# Patient Record
Sex: Male | Born: 1990 | Race: Black or African American | Hispanic: No | Marital: Single | Smoking: Never smoker
Health system: Southern US, Community
[De-identification: ages and names within clinical notes are randomized; demographics above are authoritative.]

## PROBLEM LIST (undated history)

## (undated) DIAGNOSIS — I10 Essential (primary) hypertension: Secondary | ICD-10-CM

---

## 2009-02-18 ENCOUNTER — Emergency Department (HOSPITAL_BASED_OUTPATIENT_CLINIC_OR_DEPARTMENT_OTHER): Admission: EM | Admit: 2009-02-18 | Discharge: 2009-02-18 | Payer: Self-pay | Admitting: Emergency Medicine

## 2011-06-11 ENCOUNTER — Encounter (HOSPITAL_BASED_OUTPATIENT_CLINIC_OR_DEPARTMENT_OTHER): Payer: Self-pay | Admitting: *Deleted

## 2011-06-11 ENCOUNTER — Emergency Department (HOSPITAL_BASED_OUTPATIENT_CLINIC_OR_DEPARTMENT_OTHER)
Admission: EM | Admit: 2011-06-11 | Discharge: 2011-06-11 | Disposition: A | Discharge status: Home / Self Care | Payer: Self-pay | Attending: Emergency Medicine | Admitting: Emergency Medicine

## 2011-06-11 DIAGNOSIS — R112 Nausea with vomiting, unspecified: Secondary | ICD-10-CM | POA: Insufficient documentation

## 2011-06-11 DIAGNOSIS — R109 Unspecified abdominal pain: Secondary | ICD-10-CM | POA: Insufficient documentation

## 2011-06-11 DIAGNOSIS — R197 Diarrhea, unspecified: Secondary | ICD-10-CM | POA: Insufficient documentation

## 2011-06-11 DIAGNOSIS — A084 Viral intestinal infection, unspecified: Secondary | ICD-10-CM

## 2011-06-11 LAB — DIFFERENTIAL
Basophils Absolute: 0 10*3/uL (ref 0.0–0.1)
Eosinophils Relative: 6 % — ABNORMAL HIGH (ref 0–5)
Lymphocytes Relative: 32 % (ref 12–46)
Lymphs Abs: 1.7 10*3/uL (ref 0.7–4.0)
Monocytes Absolute: 0.4 10*3/uL (ref 0.1–1.0)
Monocytes Relative: 8 % (ref 3–12)

## 2011-06-11 LAB — URINALYSIS, ROUTINE W REFLEX MICROSCOPIC
Ketones, ur: NEGATIVE mg/dL
Leukocytes, UA: NEGATIVE
Protein, ur: NEGATIVE mg/dL
Urobilinogen, UA: 1 mg/dL (ref 0.0–1.0)

## 2011-06-11 LAB — CBC
HCT: 47.2 % (ref 39.0–52.0)
Hemoglobin: 16.2 g/dL (ref 13.0–17.0)
MCV: 89.4 fL (ref 78.0–100.0)
RDW: 12.4 % (ref 11.5–15.5)
WBC: 5.1 10*3/uL (ref 4.0–10.5)

## 2011-06-11 LAB — COMPREHENSIVE METABOLIC PANEL
BUN: 12 mg/dL (ref 6–23)
CO2: 31 mEq/L (ref 19–32)
Calcium: 9.7 mg/dL (ref 8.4–10.5)
Creatinine, Ser: 1.2 mg/dL (ref 0.50–1.35)
GFR calc Af Amer: >90 mL/min (ref >90)
GFR calc non Af Amer: 85 mL/min — ABNORMAL LOW (ref >90)
Glucose, Bld: 82 mg/dL (ref 70–99)
Total Bilirubin: 0.3 mg/dL (ref 0.3–1.2)

## 2011-06-11 LAB — URINE MICROSCOPIC-ADD ON

## 2011-06-11 MED ORDER — ONDANSETRON 8 MG PO TBDP: 8.0000 mg | ORAL_TABLET | Freq: Once | ORAL | Status: AC

## 2011-06-11 MED ORDER — ONDANSETRON 8 MG PO TBDP
8.0000 mg | ORAL_TABLET | Freq: Once | ORAL | Status: AC
  Administered 2011-06-11: 8 mg via ORAL
  Filled 2011-06-11: qty 1

## 2011-06-11 NOTE — Discharge Instructions (Signed)
B.R.A.T. Diet Your doctor has recommended the B.R.A.T. diet for you or your child until the condition improves. This is often used to help control diarrhea and vomiting symptoms. If you or your child can tolerate clear liquids, you may have:  Bananas.   Rice.   Applesauce.   Toast (and other simple starches such as crackers, potatoes, noodles).  Be sure to avoid dairy products, meats, and fatty foods until symptoms are better. Fruit juices such as apple, grape, and prune juice can make diarrhea worse. Avoid these. Continue this diet for 2 days or as instructed by your caregiver. Document Released: 02/12/2005 Document Revised: 02/01/2011 Document Reviewed: 08/01/2006 [employer] Patient Information 2012 [employer].  Diet for Diarrhea, Adult Having frequent, runny stools (diarrhea) has many causes. Diarrhea may be caused or worsened by food or drink. Diarrhea may be relieved by changing your diet. IF YOU ARE NOT TOLERATING SOLID FOODS:  Drink enough water and fluids to keep your urine clear or pale yellow.   Avoid sugary drinks and sodas as well as milk-based beverages.   Avoid beverages containing caffeine and alcohol.   You may try rehydrating beverages. You can make your own by following this recipe:    tsp table salt.    tsp baking soda.   ? tsp salt substitute (potassium chloride).   1 tbs + 1 tsp sugar.   1 qt water.  As your stools become more solid, you can start eating solid foods. Add foods one at a time. If a certain food causes your diarrhea to get worse, avoid that food and try other foods. A low fiber, low-fat, and lactose-free diet is recommended. Small, frequent meals may be better tolerated.  Starches  Allowed:  White, French, and pita breads, plain rolls, buns, bagels. Plain muffins, matzo. Soda, saltine, or graham crackers. Pretzels, melba toast, zwieback. Cooked cereals made with water: cornmeal, farina, cream cereals. Dry cereals: refined corn, wheat,  rice. Potatoes prepared any way without skins, refined macaroni, spaghetti, noodles, refined rice.   Avoid:  Bread, rolls, or crackers made with whole wheat, multi-grains, rye, bran seeds, nuts, or coconut. Corn tortillas or taco shells. Cereals containing whole grains, multi-grains, bran, coconut, nuts, or raisins. Cooked or dry oatmeal. Coarse wheat cereals, granola. Cereals advertised as "high-fiber." Potato skins. Whole grain pasta, wild or brown rice. Popcorn. Sweet potatoes/yams. Sweet rolls, doughnuts, waffles, pancakes, sweet breads.  Vegetables  Allowed: Strained tomato and vegetable juices. Most well-cooked and canned vegetables without seeds. Fresh: Tender lettuce, cucumber without the skin, cabbage, spinach, bean sprouts.   Avoid: Fresh, cooked, or canned: Artichokes, baked beans, beet greens, broccoli, Brussels sprouts, corn, kale, legumes, peas, sweet potatoes. Cooked: Green or red cabbage, spinach. Avoid large servings of any vegetables, because vegetables shrink when cooked, and they contain more fiber per serving than fresh vegetables.  Fruit  Allowed: All fruit juices except prune juice. Cooked or canned: Apricots, applesauce, cantaloupe, cherries, fruit cocktail, grapefruit, grapes, kiwi, mandarin oranges, peaches, pears, plums, watermelon. Fresh: Apples without skin, ripe banana, grapes, cantaloupe, cherries, grapefruit, peaches, oranges, plums. Keep servings limited to  cup or 1 piece.   Avoid: Fresh: Apple with skin, apricots, mango, pears, raspberries, strawberries. Prune juice, stewed or dried prunes. Dried fruits, raisins, dates. Large servings of all fresh fruits.  Meat and Meat Substitutes  Allowed: Ground or well-cooked tender beef, ham, veal, lamb, pork, or poultry. Eggs, plain cheese. Fish, oysters, shrimp, lobster, other seafoods. Liver, organ meats.   Avoid: Tough, fibrous meats with   gristle. Peanut butter, smooth or chunky. Cheese, nuts, seeds, legumes, dried peas,  beans, lentils.  Milk  Allowed: Yogurt, lactose-free milk, kefir, drinkable yogurt, buttermilk, soy milk.   Avoid: Milk, chocolate milk, beverages made with milk, such as milk shakes.  Soups  Allowed: Bouillon, broth, or soups made from allowed foods. Any strained soup.   Avoid: Soups made from vegetables that are not allowed, cream or milk-based soups.  Desserts and Sweets  Allowed: Sugar-free gelatin, sugar-free frozen ice pops made without sugar alcohol.   Avoid: Plain cakes and cookies, pie made with allowed fruit, pudding, custard, cream pie. Gelatin, fruit, ice, sherbet, frozen ice pops. Ice cream, ice milk without nuts. Plain hard candy, honey, jelly, molasses, syrup, sugar, chocolate syrup, gumdrops, marshmallows.  Fats and Oils  Allowed: Avoid any fats and oils.   Avoid: Seeds, nuts, olives, avocados. Margarine, butter, cream, mayonnaise, salad oils, plain salad dressings made from allowed foods. Plain gravy, crisp bacon without rind.  Beverages  Allowed: Water, decaffeinated teas, oral rehydration solutions, sugar-free beverages.   Avoid: Fruit juices, caffeinated beverages (coffee, tea, soda or pop), alcohol, sports drinks, or lemon-lime soda or pop.  Condiments  Allowed: Ketchup, mustard, horseradish, vinegar, cream sauce, cheese sauce, cocoa powder. Spices in moderation: allspice, basil, bay leaves, celery powder or leaves, cinnamon, cumin powder, curry powder, ginger, mace, marjoram, onion or garlic powder, oregano, paprika, parsley flakes, ground pepper, rosemary, sage, savory, tarragon, thyme, turmeric.   Avoid: Coconut, honey.  Weight Monitoring: Weigh yourself every day. You should weigh yourself in the morning after you urinate and before you eat breakfast. Wear the same amount of clothing when you weigh yourself. Record your weight daily. Bring your recorded weights to your clinic visits. Tell your caregiver right away if you have gained 3 lb/1.4 kg or more in 1  day, 5 lb/2.3 kg in a week, or whatever amount you were told to report. SEEK IMMEDIATE MEDICAL CARE IF:   You are unable to keep fluids down.   You start to throw up (vomit) or diarrhea keeps coming back (persistent).   Abdominal pain develops, increases, or can be felt in one place (localizes).   You have an oral temperature above 102 F (38.9 C), not controlled by medicine.   Diarrhea contains blood or mucus.   You develop excessive weakness, dizziness, fainting, or extreme thirst.  MAKE SURE YOU:   Understand these instructions.   Will watch your condition.   Will get help right away if you are not doing well or get worse.  Document Released: 05/05/2003 Document Revised: 02/01/2011 Document Reviewed: 08/26/2008 [employer] Patient Information 2012 [employer].  Diet for Diarrhea, Adult Having frequent, runny stools (diarrhea) has many causes. Diarrhea may be caused or worsened by food or drink. Diarrhea may be relieved by changing your diet. IF YOU ARE NOT TOLERATING SOLID FOODS:  Drink enough water and fluids to keep your urine clear or pale yellow.   Avoid sugary drinks and sodas as well as milk-based beverages.   Avoid beverages containing caffeine and alcohol.   You may try rehydrating beverages. You can make your own by following this recipe:    tsp table salt.    tsp baking soda.   ? tsp salt substitute (potassium chloride).   1 tbs + 1 tsp sugar.   1 qt water.  As your stools become more solid, you can start eating solid foods. Add foods one at a time. If a certain food causes your diarrhea to get   worse, avoid that food and try other foods. A low fiber, low-fat, and lactose-free diet is recommended. Small, frequent meals may be better tolerated.  Starches  Allowed:  White, French, and pita breads, plain rolls, buns, bagels. Plain muffins, matzo. Soda, saltine, or graham crackers. Pretzels, melba toast, zwieback. Cooked cereals made with water: cornmeal,  farina, cream cereals. Dry cereals: refined corn, wheat, rice. Potatoes prepared any way without skins, refined macaroni, spaghetti, noodles, refined rice.   Avoid:  Bread, rolls, or crackers made with whole wheat, multi-grains, rye, bran seeds, nuts, or coconut. Corn tortillas or taco shells. Cereals containing whole grains, multi-grains, bran, coconut, nuts, or raisins. Cooked or dry oatmeal. Coarse wheat cereals, granola. Cereals advertised as "high-fiber." Potato skins. Whole grain pasta, wild or brown rice. Popcorn. Sweet potatoes/yams. Sweet rolls, doughnuts, waffles, pancakes, sweet breads.  Vegetables  Allowed: Strained tomato and vegetable juices. Most well-cooked and canned vegetables without seeds. Fresh: Tender lettuce, cucumber without the skin, cabbage, spinach, bean sprouts.   Avoid: Fresh, cooked, or canned: Artichokes, baked beans, beet greens, broccoli, Brussels sprouts, corn, kale, legumes, peas, sweet potatoes. Cooked: Green or red cabbage, spinach. Avoid large servings of any vegetables, because vegetables shrink when cooked, and they contain more fiber per serving than fresh vegetables.  Fruit  Allowed: All fruit juices except prune juice. Cooked or canned: Apricots, applesauce, cantaloupe, cherries, fruit cocktail, grapefruit, grapes, kiwi, mandarin oranges, peaches, pears, plums, watermelon. Fresh: Apples without skin, ripe banana, grapes, cantaloupe, cherries, grapefruit, peaches, oranges, plums. Keep servings limited to  cup or 1 piece.   Avoid: Fresh: Apple with skin, apricots, mango, pears, raspberries, strawberries. Prune juice, stewed or dried prunes. Dried fruits, raisins, dates. Large servings of all fresh fruits.  Meat and Meat Substitutes  Allowed: Ground or well-cooked tender beef, ham, veal, lamb, pork, or poultry. Eggs, plain cheese. Fish, oysters, shrimp, lobster, other seafoods. Liver, organ meats.   Avoid: Tough, fibrous meats with gristle. Peanut butter,  smooth or chunky. Cheese, nuts, seeds, legumes, dried peas, beans, lentils.  Milk  Allowed: Yogurt, lactose-free milk, kefir, drinkable yogurt, buttermilk, soy milk.   Avoid: Milk, chocolate milk, beverages made with milk, such as milk shakes.  Soups  Allowed: Bouillon, broth, or soups made from allowed foods. Any strained soup.   Avoid: Soups made from vegetables that are not allowed, cream or milk-based soups.  Desserts and Sweets  Allowed: Sugar-free gelatin, sugar-free frozen ice pops made without sugar alcohol.   Avoid: Plain cakes and cookies, pie made with allowed fruit, pudding, custard, cream pie. Gelatin, fruit, ice, sherbet, frozen ice pops. Ice cream, ice milk without nuts. Plain hard candy, honey, jelly, molasses, syrup, sugar, chocolate syrup, gumdrops, marshmallows.  Fats and Oils  Allowed: Avoid any fats and oils.   Avoid: Seeds, nuts, olives, avocados. Margarine, butter, cream, mayonnaise, salad oils, plain salad dressings made from allowed foods. Plain gravy, crisp bacon without rind.  Beverages  Allowed: Water, decaffeinated teas, oral rehydration solutions, sugar-free beverages.   Avoid: Fruit juices, caffeinated beverages (coffee, tea, soda or pop), alcohol, sports drinks, or lemon-lime soda or pop.  Condiments  Allowed: Ketchup, mustard, horseradish, vinegar, cream sauce, cheese sauce, cocoa powder. Spices in moderation: allspice, basil, bay leaves, celery powder or leaves, cinnamon, cumin powder, curry powder, ginger, mace, marjoram, onion or garlic powder, oregano, paprika, parsley flakes, ground pepper, rosemary, sage, savory, tarragon, thyme, turmeric.   Avoid: Coconut, honey.  Weight Monitoring: Weigh yourself every day. You should weigh yourself in the morning after   you urinate and before you eat breakfast. Wear the same amount of clothing when you weigh yourself. Record your weight daily. Bring your recorded weights to your clinic visits. Tell your  caregiver right away if you have gained 3 lb/1.4 kg or more in 1 day, 5 lb/2.3 kg in a week, or whatever amount you were told to report. SEEK IMMEDIATE MEDICAL CARE IF:   You are unable to keep fluids down.   You start to throw up (vomit) or diarrhea keeps coming back (persistent).   Abdominal pain develops, increases, or can be felt in one place (localizes).   You have an oral temperature above 102 F (38.9 C), not controlled by medicine.   Diarrhea contains blood or mucus.   You develop excessive weakness, dizziness, fainting, or extreme thirst.  MAKE SURE YOU:   Understand these instructions.   Will watch your condition.   Will get help right away if you are not doing well or get worse.  Document Released: 05/05/2003 Document Revised: 02/01/2011 Document Reviewed: 08/26/2008 [employer] Patient Information 2012 [employer]. 

## 2011-06-11 NOTE — ED Provider Notes (Signed)
History     CSN: [id number]  Arrival date & time 06/11/11  1234   First MD Initiated Contact with Patient 06/11/11 1244      1:20 PM HPI Patient reports waking this morning with N/V/D. Reports also associated with abdominal cramps.  Reports abdominal cramps resolve after he has a bowel movement. Denies fever, hematochezia, hematemesis, urinary symptoms, back pain. 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  Patient is a 21 y.o. male presenting with diarrhea. The history is provided by the patient.  Diarrhea The primary symptoms include abdominal pain, nausea, vomiting and diarrhea. Primary symptoms do not include fever, melena, hematemesis, hematochezia, dysuria or myalgias. The illness began today. The onset was sudden. The problem has not changed since onset. The abdominal pain began today. The abdominal pain has been unchanged since its onset. The abdominal pain is generalized. The abdominal pain does not radiate. The severity of the abdominal pain is 0/10. The abdominal pain is relieved by vomiting and bowel movements.  The diarrhea began today. The diarrhea is watery. The diarrhea occurs 2 to 4 times per day.  The illness does not include chills, constipation or back pain.    History reviewed. No pertinent past medical history.  History reviewed. No pertinent past surgical history.  History reviewed. No pertinent family history.  History  Substance Use Topics  . Smoking status: Never Smoker   . Smokeless tobacco: Not on file  . Alcohol Use: No      Review of Systems  Constitutional: Negative for fever and chills.  Respiratory: Negative for shortness of breath.   Cardiovascular: Negative for chest pain.  Gastrointestinal: Positive for nausea, vomiting, abdominal pain and diarrhea. Negative for constipation, blood in stool, melena, hematochezia, rectal pain and hematemesis.  Genitourinary: Negative for dysuria, hematuria, flank pain, discharge,  penile pain and testicular pain.  Musculoskeletal: Negative for myalgias and back pain.  Neurological: Negative for dizziness, weakness, numbness and headaches.  All other systems reviewed and are negative.    Allergies  Review of patient's allergies indicates no known allergies.  Home Medications  No current outpatient prescriptions on file.  BP 132/84  Pulse 70  Temp(Src) 98 F (36.7 C) (Oral)  Resp 18  Ht 5\' 8"  (1.727 m)  Wt 135 lb (61.236 kg)  BMI 20.53 kg/m2  SpO2 100%  Physical Exam  Constitutional: He is oriented to person, place, and time. He appears well-developed and well-nourished.  HENT:  Head: Normocephalic and atraumatic.  Eyes: Conjunctivae are normal. Pupils are equal, round, and reactive to light.  Neck: Normal range of motion. Neck supple.  Cardiovascular: Normal rate, regular rhythm and normal heart sounds.   Pulmonary/Chest: Effort normal and breath sounds normal.  Abdominal: Soft. Bowel sounds are normal.  Neurological: He is alert and oriented to person, place, and time.  Skin: Skin is warm and dry. No rash noted. No erythema. No pallor.  Psychiatric: He has a normal mood and affect. His behavior is normal.    ED Course  Procedures  Results for orders placed during the hospital encounter of 06/11/11  CBC      Component Value Range   WBC 5.1  4.0 - 10.5 (K/uL)   RBC 5.28  4.22 - 5.81 (MIL/uL)   Hemoglobin 16.2  13.0 - 17.0 (g/dL)   HCT 72.5  36.6 - 44.0 (%)   MCV 89.4  78.0 - 100.0 (fL)   MCH 30.7  26.0 - 34.0 (pg)   MCHC 34.3  30.0 - 36.0 (g/dL)   RDW 40.9  81.1 - 91.4 (%)   Platelets 175  150 - 400 (K/uL)  DIFFERENTIAL      Component Value Range   Neutrophils Relative 53  43 - 77 (%)   Neutro Abs 2.7  1.7 - 7.7 (K/uL)   Lymphocytes Relative 32  12 - 46 (%)   Lymphs Abs 1.7  0.7 - 4.0 (K/uL)   Monocytes Relative 8  3 - 12 (%)   Monocytes Absolute 0.4  0.1 - 1.0 (K/uL)   Eosinophils Relative 6 (*) 0 - 5 (%)   Eosinophils Absolute 0.3   0.0 - 0.7 (K/uL)   Basophils Relative 0  0 - 1 (%)   Basophils Absolute 0.0  0.0 - 0.1 (K/uL)  COMPREHENSIVE METABOLIC PANEL      Component Value Range   Sodium 144  135 - 145 (mEq/L)   Potassium 3.8  3.5 - 5.1 (mEq/L)   Chloride 107  96 - 112 (mEq/L)   CO2 31  19 - 32 (mEq/L)   Glucose, Bld 82  70 - 99 (mg/dL)   BUN 12  6 - 23 (mg/dL)   Creatinine, Ser 7.82  0.50 - 1.35 (mg/dL)   Calcium 9.7  8.4 - 95.6 (mg/dL)   Total Protein 7.2  6.0 - 8.3 (g/dL)   Albumin 4.4  3.5 - 5.2 (g/dL)   AST 24  0 - 37 (U/L)   ALT 15  0 - 53 (U/L)   Alkaline Phosphatase 84  39 - 117 (U/L)   Total Bilirubin 0.3  0.3 - 1.2 (mg/dL)   GFR calc non Af Amer 85 (*) >90 (mL/min)   GFR calc Af Amer >90  >90 (mL/min)  LIPASE, BLOOD      Component Value Range   Lipase 20  11 - 59 (U/L)  URINALYSIS, ROUTINE W REFLEX MICROSCOPIC      Component Value Range   Color, Urine YELLOW  YELLOW    APPearance TURBID (*) CLEAR    Specific Gravity, Urine 1.021  1.005 - 1.030    pH 8.0  5.0 - 8.0    Glucose, UA NEGATIVE  NEGATIVE (mg/dL)   Hgb urine dipstick NEGATIVE  NEGATIVE    Bilirubin Urine NEGATIVE  NEGATIVE    Ketones, ur NEGATIVE  NEGATIVE (mg/dL)   Protein, ur NEGATIVE  NEGATIVE (mg/dL)   Urobilinogen, UA 1.0  0.0 - 1.0 (mg/dL)   Nitrite NEGATIVE  NEGATIVE    Leukocytes, UA NEGATIVE  NEGATIVE   URINE MICROSCOPIC-ADD ON      Component Value Range   Squamous Epithelial / LPF RARE  RARE    WBC, UA 0-2  <3 (WBC/hpf)   RBC / HPF    <3 (RBC/hpf)   Value: NO FORMED ELEMENTS SEEN ON URINE MICROSCOPIC EXAMINATION   Bacteria, UA FEW (*) RARE    Urine-Other AMORPHOUS URATES/PHOSPHATES     No results found.  MDM   1:27 PM Patient immediately asked for a note for work  2:38 PM Labs within nml limits. Likely has a viral gastroenteritis Will Treat with antiemetics and advised b.r.a.t. diet. Patient agrees to plan and is ready for discharge     Thomasene Lot, PA-C 06/11/11 1440

## 2011-06-11 NOTE — ED Notes (Signed)
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 Has not vomited has had several loose stools. Able to drink fluids and keep down

## 2011-06-11 NOTE — ED Provider Notes (Signed)
Medical screening examination/treatment/procedure(s) were performed by non-physician practitioner and as supervising physician I was immediately available for consultation/collaboration.   Loren Racer, MD 06/11/11 [id number]

## 2015-01-03 ENCOUNTER — Encounter (HOSPITAL_BASED_OUTPATIENT_CLINIC_OR_DEPARTMENT_OTHER): Payer: Self-pay | Admitting: *Deleted

## 2015-01-03 ENCOUNTER — Emergency Department (HOSPITAL_BASED_OUTPATIENT_CLINIC_OR_DEPARTMENT_OTHER): Payer: Self-pay

## 2015-01-03 ENCOUNTER — Emergency Department (HOSPITAL_BASED_OUTPATIENT_CLINIC_OR_DEPARTMENT_OTHER)
Admission: EM | Admit: 2015-01-03 | Discharge: 2015-01-03 | Disposition: A | Discharge status: Home / Self Care | Payer: Self-pay | Attending: Emergency Medicine | Admitting: Emergency Medicine

## 2015-01-03 DIAGNOSIS — R519 Headache, unspecified: Secondary | ICD-10-CM

## 2015-01-03 DIAGNOSIS — K5909 Other constipation: Secondary | ICD-10-CM | POA: Insufficient documentation

## 2015-01-03 DIAGNOSIS — R51 Headache: Secondary | ICD-10-CM | POA: Insufficient documentation

## 2015-01-03 DIAGNOSIS — I10 Essential (primary) hypertension: Secondary | ICD-10-CM | POA: Insufficient documentation

## 2015-01-03 HISTORY — DX: Essential (primary) hypertension: I10

## 2015-01-03 LAB — URINALYSIS, ROUTINE W REFLEX MICROSCOPIC
BILIRUBIN URINE: NEGATIVE
Glucose, UA: NEGATIVE mg/dL
Hgb urine dipstick: NEGATIVE
Ketones, ur: NEGATIVE mg/dL
Leukocytes, UA: NEGATIVE
Nitrite: NEGATIVE
PROTEIN: NEGATIVE mg/dL
SPECIFIC GRAVITY, URINE: 1.003 — AB (ref 1.005–1.030)
UROBILINOGEN UA: 0.2 mg/dL (ref 0.0–1.0)
pH: 7 (ref 5.0–8.0)

## 2015-01-03 LAB — COMPREHENSIVE METABOLIC PANEL
ALBUMIN: 4.9 g/dL (ref 3.5–5.0)
ALK PHOS: 86 U/L (ref 38–126)
ALT: 26 U/L (ref 17–63)
AST: 27 U/L (ref 15–41)
Anion gap: 7 (ref 5–15)
BUN: 15 mg/dL (ref 6–20)
CALCIUM: 9.5 mg/dL (ref 8.9–10.3)
CHLORIDE: 103 mmol/L (ref 101–111)
CO2: 29 mmol/L (ref 22–32)
CREATININE: 1.35 mg/dL — AB (ref 0.61–1.24)
GFR calc Af Amer: >60 mL/min (ref >60)
GFR calc non Af Amer: >60 mL/min (ref >60)
GLUCOSE: 105 mg/dL — AB (ref 65–99)
Potassium: 3.5 mmol/L (ref 3.5–5.1)
SODIUM: 139 mmol/L (ref 135–145)
Total Bilirubin: 0.5 mg/dL (ref 0.3–1.2)
Total Protein: 8 g/dL (ref 6.5–8.1)

## 2015-01-03 LAB — CBC WITH DIFFERENTIAL/PLATELET
BASOS ABS: 0 10*3/uL (ref 0.0–0.1)
BASOS PCT: 0 %
EOS ABS: 0.4 10*3/uL (ref 0.0–0.7)
Eosinophils Relative: 5 %
HCT: 47.9 % (ref 39.0–52.0)
HEMOGLOBIN: 16.3 g/dL (ref 13.0–17.0)
LYMPHS ABS: 2.5 10*3/uL (ref 0.7–4.0)
Lymphocytes Relative: 35 %
MCH: 30 pg (ref 26.0–34.0)
MCHC: 34 g/dL (ref 30.0–36.0)
MCV: 88.2 fL (ref 78.0–100.0)
Monocytes Absolute: 0.6 10*3/uL (ref 0.1–1.0)
Monocytes Relative: 8 %
NEUTROS PCT: 52 %
Neutro Abs: 3.6 10*3/uL (ref 1.7–7.7)
PLATELETS: 236 10*3/uL (ref 150–400)
RBC: 5.43 MIL/uL (ref 4.22–5.81)
RDW: 12.8 % (ref 11.5–15.5)
WBC: 7.2 10*3/uL (ref 4.0–10.5)

## 2015-01-03 MED ORDER — POLYETHYLENE GLYCOL 3350 17 GM/SCOOP PO POWD: ORAL | Status: DC

## 2015-01-03 MED ORDER — AMLODIPINE BESYLATE 5 MG PO TABS
5.0000 mg | ORAL_TABLET | Freq: Once | ORAL | Status: AC
  Administered 2015-01-03: 5 mg via ORAL
  Filled 2015-01-03: qty 1

## 2015-01-03 NOTE — ED Notes (Signed)
Abdominal pain and headache x 2 days.

## 2015-01-03 NOTE — Discharge Instructions (Signed)
Constipation, Adult Constipation is when a person has fewer than three bowel movements a week, has difficulty having a bowel movement, or has stools that are dry, hard, or larger than normal. As people grow older, constipation is more common. A low-fiber diet, not taking in enough fluids, and taking certain medicines may make constipation worse.  CAUSES   Certain medicines, such as antidepressants, pain medicine, iron supplements, antacids, and water pills.   Certain diseases, such as diabetes, irritable bowel syndrome (IBS), thyroid disease, or depression.   Not drinking enough water.   Not eating enough fiber-rich foods.   Stress or travel.   Lack of physical activity or exercise.   Ignoring the urge to have a bowel movement.   Using laxatives too much.  SIGNS AND SYMPTOMS   Having fewer than three bowel movements a week.   Straining to have a bowel movement.   Having stools that are hard, dry, or larger than normal.   Feeling full or bloated.   Pain in the lower abdomen.   Not feeling relief after having a bowel movement.  DIAGNOSIS  Your health care provider will take a medical history and perform a physical exam. Further testing may be done for severe constipation. Some tests may include:  A barium enema X-ray to examine your rectum, colon, and, sometimes, your small intestine.   A sigmoidoscopy to examine your lower colon.   A colonoscopy to examine your entire colon. TREATMENT  Treatment will depend on the severity of your constipation and what is causing it. Some dietary treatments include drinking more fluids and eating more fiber-rich foods. Lifestyle treatments may include regular exercise. If these diet and lifestyle recommendations do not help, your health care provider may recommend taking over-the-counter laxative medicines to help you have bowel movements. Prescription medicines may be prescribed if over-the-counter medicines do not work.    HOME CARE INSTRUCTIONS   Eat foods that have a lot of fiber, such as fruits, vegetables, whole grains, and beans.  Limit foods high in fat and processed sugars, such as french fries, hamburgers, cookies, candies, and soda.   A fiber supplement may be added to your diet if you cannot get enough fiber from foods.   Drink enough fluids to keep your urine clear or pale yellow.   Exercise regularly or as directed by your health care provider.   Go to the restroom when you have the urge to go. Do not hold it.   Only take over-the-counter or prescription medicines as directed by your health care provider. Do not take other medicines for constipation without talking to your health care provider first.  SEEK IMMEDIATE MEDICAL CARE IF:   You have bright red blood in your stool.   Your constipation lasts for more than 4 days or gets worse.   You have abdominal or rectal pain.   You have thin, pencil-like stools.   You have unexplained weight loss. MAKE SURE YOU:   Understand these instructions.  Will watch your condition.  Will get help right away if you are not doing well or get worse.   This information is not intended to replace advice given to you by your health care provider. Make sure you discuss any questions you have with your health care provider.   Document Released: 11/11/2003 Document Revised: 03/05/2014 Document Reviewed: 11/24/2012 Elsevier Interactive Patient Education 2016 Elsevier Inc. Hypertension Hypertension, commonly called high blood pressure, is when the force of blood pumping through your arteries is too strong.   Your arteries are the blood vessels that carry blood from your heart throughout your body. A blood pressure reading consists of a higher number over a lower number, such as 110/72. The higher number (systolic) is the pressure inside your arteries when your heart pumps. The lower number (diastolic) is the pressure inside your arteries when your  heart relaxes. Ideally you want your blood pressure below 120/80. Hypertension forces your heart to work harder to pump blood. Your arteries may become narrow or stiff. Having untreated or uncontrolled hypertension can cause heart attack, stroke, kidney disease, and other problems. RISK FACTORS Some risk factors for high blood pressure are controllable. Others are not.  Risk factors you cannot control include:   Race. You may be at higher risk if you are African American.  Age. Risk increases with age.  Gender. Men are at higher risk than women before age 45 years. After age 65, women are at higher risk than men. Risk factors you can control include:  Not getting enough exercise or physical activity.  Being overweight.  Getting too much fat, sugar, calories, or salt in your diet.  Drinking too much alcohol. SIGNS AND SYMPTOMS Hypertension does not usually cause signs or symptoms. Extremely high blood pressure (hypertensive crisis) may cause headache, anxiety, shortness of breath, and nosebleed. DIAGNOSIS To check if you have hypertension, your health care provider will measure your blood pressure while you are seated, with your arm held at the level of your heart. It should be measured at least twice using the same arm. Certain conditions can cause a difference in blood pressure between your right and left arms. A blood pressure reading that is higher than normal on one occasion does not mean that you need treatment. If it is not clear whether you have high blood pressure, you may be asked to return on a different day to have your blood pressure checked again. Or, you may be asked to monitor your blood pressure at home for 1 or more weeks. TREATMENT Treating high blood pressure includes making lifestyle changes and possibly taking medicine. Living a healthy lifestyle can help lower high blood pressure. You may need to change some of your habits. Lifestyle changes may include:  Following  the DASH diet. This diet is high in fruits, vegetables, and whole grains. It is low in salt, red meat, and added sugars.  Keep your sodium intake below 2,300 mg per day.  Getting at least 30-45 minutes of aerobic exercise at least 4 times per week.  Losing weight if necessary.  Not smoking.  Limiting alcoholic beverages.  Learning ways to reduce stress. Your health care provider may prescribe medicine if lifestyle changes are not enough to get your blood pressure under control, and if one of the following is true:  You are 18-59 years of age and your systolic blood pressure is above 140.  You are 60 years of age or older, and your systolic blood pressure is above 150.  Your diastolic blood pressure is above 90.  You have diabetes, and your systolic blood pressure is over 140 or your diastolic blood pressure is over 90.  You have kidney disease and your blood pressure is above 140/90.  You have heart disease and your blood pressure is above 140/90. Your personal target blood pressure may vary depending on your medical conditions, your age, and other factors. HOME CARE INSTRUCTIONS  Have your blood pressure rechecked as directed by your health care provider.   Take medicines only as   directed by your health care provider. Follow the directions carefully. Blood pressure medicines must be taken as prescribed. The medicine does not work as well when you skip doses. Skipping doses also puts you at risk for problems.  Do not smoke.   Monitor your blood pressure at home as directed by your health care provider. SEEK MEDICAL CARE IF:   You think you are having a reaction to medicines taken.  You have recurrent headaches or feel dizzy.  You have swelling in your ankles.  You have trouble with your vision. SEEK IMMEDIATE MEDICAL CARE IF:  You develop a severe headache or confusion.  You have unusual weakness, numbness, or feel faint.  You have severe chest or abdominal  pain.  You vomit repeatedly.  You have trouble breathing. MAKE SURE YOU:   Understand these instructions.  Will watch your condition.  Will get help right away if you are not doing well or get worse.   This information is not intended to replace advice given to you by your health care provider. Make sure you discuss any questions you have with your health care provider.   Document Released: 02/12/2005 Document Revised: 06/29/2014 Document Reviewed: 12/05/2012 Elsevier Interactive Patient Education 2016 Elsevier Inc.  

## 2015-01-03 NOTE — ED Provider Notes (Signed)
CSN: [id number]     Arrival date & time 01/03/15  1537 History  By signing my name below, I, Trevor Pope, attest that this documentation has been prepared under the direction and in the presence of Trevor Baton, MD. Electronically Signed: Murriel Pope, ED Scribe. 01/03/2015. 4:00 PM.    Chief Complaint  Patient presents with  . Abdominal Pain      The history is provided by the patient. No language interpreter was used.   HPI Comments: Trevor Pope is a 24 y.o. male who presents to the Emergency Department complaining of constant lower 4/10 abdominal pain with associated 5/10 dull, frontal headache that has been present for two days. Denies neck pain. Denies worst headache of his life. Pt reports he is prescribed HTN medication, but reports he has not taken it in the past 3 days because he has been busy. Pt states that nothing makes his headache or abdominal pain better or worse. Pt states the last bowel movement he had was earlier today. Pt denies constipation, vomiting, fever, urinary symptoms.   Past Medical History  Diagnosis Date  . Hypertension    History reviewed. No pertinent past surgical history. No family history on file. Social History  Substance Use Topics  . Smoking status: Never Smoker   . Smokeless tobacco: None  . Alcohol Use: No    Review of Systems  Constitutional: Negative for fever.  Eyes: Negative for photophobia.  Respiratory: Negative for shortness of breath.   Cardiovascular: Negative for chest pain.  Gastrointestinal: Positive for abdominal pain. Negative for nausea, vomiting and constipation.  Genitourinary: Negative for dysuria, urgency, frequency, hematuria, decreased urine volume and difficulty urinating.  Musculoskeletal: Negative for back pain.  Neurological: Positive for headaches.  All other systems reviewed and are negative.     Allergies  Review of patient's allergies indicates no known allergies.  Home Medications   Prior  to Admission medications   Medication Sig Start Date End Date Taking? Authorizing Provider  AmLODIPine Besylate (NORVASC PO) Take by mouth.   Yes Historical Provider, MD  polyethylene glycol powder (MIRALAX) powder Take one capful by mouth twice daily until stools are loose 01/03/15   Mayer Masker Horton, MD   BP 155/92 mmHg  Pulse 84  Temp(Src) 98.6 F (37 C) (Oral)  Resp 16  SpO2 97% Physical Exam  Constitutional: He is oriented to person, place, and time. He appears well-developed and well-nourished. No distress.  HENT:  Head: Normocephalic and atraumatic.  Mouth/Throat: Oropharynx is clear and moist.  Eyes: Pupils are equal, round, and reactive to light.  Neck: Neck supple.  Cardiovascular: Normal rate, regular rhythm and normal heart sounds.   No murmur heard. Pulmonary/Chest: Effort normal and breath sounds normal. No respiratory distress. He has no wheezes.  Abdominal: Soft. Bowel sounds are normal. There is no tenderness. There is no rebound and no guarding.  Musculoskeletal: He exhibits no edema.  Neurological: He is alert and oriented to person, place, and time.  Cranial nerves II through XII intact, 5 over 5 strength in all 4 extremities  Skin: Skin is warm and dry.  Psychiatric: He has a normal mood and affect.  Nursing note and vitals reviewed.   ED Course  Procedures (including critical care time)  DIAGNOSTIC STUDIES: Oxygen Saturation is 97% on room air, normal by my interpretation.    COORDINATION OF CARE: 4:00 PM Discussed treatment plan with pt at bedside and pt agreed to plan.   Labs Review Labs Reviewed  URINALYSIS, ROUTINE W REFLEX MICROSCOPIC (NOT AT [facility]) - Abnormal; Notable for the following:    Specific Gravity, Urine 1.003 (*)    All other components within normal limits  COMPREHENSIVE METABOLIC PANEL - Abnormal; Notable for the following:    Glucose, Bld 105 (*)    Creatinine, Ser 1.35 (*)    All other components within normal limits  CBC WITH  DIFFERENTIAL/PLATELET    Imaging Review Dg Abd 1 View  01/03/2015  CLINICAL DATA:  Abdominal pain for several days EXAM: ABDOMEN - 1 VIEW COMPARISON:  None. FINDINGS: There is moderate stool throughout colon. There is no bowel dilatation or air-fluid level suggesting obstruction. No free air. No abnormal calcifications. Lung bases clear. IMPRESSION: Moderate stool throughout colon.  Bowel gas pattern unremarkable. Electronically Signed   By: Bretta BangWilliam  Woodruff III M.D.   On: 01/03/2015 16:11   I have personally reviewed and evaluated these images and lab results as part of my medical decision-making.   EKG Interpretation None      MDM   Final diagnoses:  Other constipation  Essential hypertension  Nonintractable headache, unspecified chronicity pattern, unspecified headache type    Patient presents with abdominal pain and headache. Nontoxic on exam. Afebrile. Nonfocal. Patient is mildly hypertensive with blood pressure 155/92. Has not taken his blood pressure medications today. Patient was given Norvasc which is a home medication for him. His abdominal exam is benign. He reports normal bowel movement but given the description of pain, constipation or urinary tract infection would be a consideration. Low suspicion at this time for appendicitis. He's been afebrile. Basic labwork obtained and largely reassuring.  He does have a mild increase in his creatinine. I discussed with patient that this may be reflection of poorly controlled hypertension. He was encouraged to take his blood pressure medications as directed. No signs or symptoms of hypertensive urgency or emergency at this time.  Abdominal x-ray with moderate stool burden. Discussed this with the patient. Will do MiraLAX twice daily.  Patient does have a primary physician. Follow-up next available.  After history, exam, and medical workup I feel the patient has been appropriately medically screened and is safe for discharge home. Pertinent  diagnoses were discussed with the patient. Patient was given return precautions.   I personally performed the services described in this documentation, which was scribed in my presence. The recorded information has been reviewed and is accurate.    Trevor Batonourtney F Horton, MD 01/03/15 1710

## 2015-03-03 ENCOUNTER — Encounter (HOSPITAL_BASED_OUTPATIENT_CLINIC_OR_DEPARTMENT_OTHER): Payer: Self-pay

## 2015-03-03 ENCOUNTER — Emergency Department (HOSPITAL_BASED_OUTPATIENT_CLINIC_OR_DEPARTMENT_OTHER)
Admission: EM | Admit: 2015-03-03 | Discharge: 2015-03-03 | Disposition: A | Discharge status: Home / Self Care | Payer: Self-pay | Attending: Emergency Medicine | Admitting: Emergency Medicine

## 2015-03-03 DIAGNOSIS — Z79899 Other long term (current) drug therapy: Secondary | ICD-10-CM | POA: Insufficient documentation

## 2015-03-03 DIAGNOSIS — H6692 Otitis media, unspecified, left ear: Secondary | ICD-10-CM | POA: Insufficient documentation

## 2015-03-03 DIAGNOSIS — I10 Essential (primary) hypertension: Secondary | ICD-10-CM | POA: Insufficient documentation

## 2015-03-03 MED ORDER — AMOXICILLIN 500 MG PO CAPS: 500.0000 mg | ORAL_CAPSULE | Freq: Three times a day (TID) | ORAL | Status: DC

## 2015-03-03 NOTE — ED Provider Notes (Signed)
CSN: [id number]     Arrival date & time 03/03/15  1912 History   First MD Initiated Contact with Patient 03/03/15 2028     Chief Complaint  Patient presents with  . Otalgia     (Consider location/radiation/quality/duration/timing/severity/associated sxs/prior Treatment) HPI Judee ClaraRobert Hunkele is a 25 y.o. male who comes in for evaluation of left ear pain. Patient reports for the past 2 days he felt like "there is water in my ear". He denies any pain. He reports associated sore throat and cough. He has not tried anything to improve his symptoms. Denies any nausea, vomiting, dizziness, headache, vision changes. Nothing makes this problem better or worse. Denies any recent swimming or submersion's. No other modifying factors.  Past Medical History  Diagnosis Date  . Hypertension    History reviewed. No pertinent past surgical history. No family history on file. Social History  Substance Use Topics  . Smoking status: Never Smoker   . Smokeless tobacco: None  . Alcohol Use: No    Review of Systems  A 10 point review of systems was completed and was negative except for pertinent positives and negatives as mentioned in the history of present illness    Allergies  Review of patient's allergies indicates no known allergies.  Home Medications   Prior to Admission medications   Medication Sig Start Date End Date Taking? Authorizing Provider  AmLODIPine Besylate (NORVASC PO) Take by mouth.    Historical Provider, MD  amoxicillin (AMOXIL) 500 MG capsule Take 1 capsule (500 mg total) by mouth 3 (three) times daily. 03/03/15   Sarie Stall, PA-C   BP 146/95 mmHg  Pulse 70  Temp(Src) 97.7 F (36.5 C) (Oral)  Resp 16  Ht 5\' 8"  (1.727 m)  Wt 72.576 kg  BMI 24.33 kg/m2  SpO2 100% Physical Exam  Constitutional:  Awake, alert, nontoxic appearance.  HENT:  Head: Atraumatic.  Left TM bulging and somewhat erythematous. Right TM normal.  Eyes: Right eye exhibits no discharge. Left eye  exhibits no discharge.  Neck: Neck supple.  Pulmonary/Chest: Effort normal. He exhibits no tenderness.  Abdominal: Soft. There is no tenderness. There is no rebound.  Musculoskeletal: He exhibits no tenderness.  Baseline ROM, no obvious new focal weakness.  Neurological:  Mental status and motor strength appears baseline for patient and situation.  Skin: No rash noted.  Psychiatric: He has a normal mood and affect.  Nursing note and vitals reviewed.   ED Course  Procedures (including critical care time) Labs Review Labs Reviewed - No data to display  Imaging Review No results found. I have personally reviewed and evaluated these images and lab results as part of my medical decision-making.   EKG Interpretation None     Filed Vitals:   03/03/15 1916 03/03/15 2129  BP: 145/110 146/95  Pulse: 80 70  Temp: 97.7 F (36.5 C)   TempSrc: Oral   Resp: 18 16  Height: 5\' 8"  (1.727 m)   Weight: 72.576 kg   SpO2: 100% 100%   Meds given in ED:  Medications - No data to display  Discharge Medication List as of 03/03/2015  9:05 PM    START taking these medications   Details  amoxicillin (AMOXIL) 500 MG capsule Take 1 capsule (500 mg total) by mouth 3 (three) times daily., Starting 03/03/2015, Until Discontinued, Print       Filed Vitals:   03/03/15 1916 03/03/15 2129  BP: 145/110 146/95  Pulse: 80 70  Temp: 97.7 F (36.5 C)  TempSrc: Oral   Resp: 18 16  Height: 5\' 8"  (1.727 m)   Weight: 72.576 kg   SpO2: 100% 100%    MDM  Patient presents with otalgia and exam consistent with acute otitis media. No concern for acute mastoiditis, meningitis.  Doubt labyrinthitis, neuronitis, Mnire's. No antibiotic use in the last month.  Patient discharged home with Amoxicillin.  Discussed follow-up with PCP next week.  I have also discussed reasons to return immediately to the ER.  Patient expresses understanding and agrees with plan. The patient appears reasonably screened and/or  stabilized for discharge and I doubt any other medical condition or other [facility] requiring further screening, evaluation, or treatment in the ED at this time prior to discharge.    Final diagnoses:  Acute left otitis media, recurrence not specified, unspecified otitis media type        Joycie Peek, PA-C 03/07/15 1610  Arby Barrette, MD 03/08/15 [phone]

## 2015-03-03 NOTE — ED Notes (Signed)
Left ear issue x 2 days-states "water in my ear"-denies pain

## 2015-03-03 NOTE — Discharge Instructions (Signed)
Take your medications as prescribed. Follow-up with your doctor in 1 week for reevaluation. Return to ED for any new or worsening symptoms as we discussed.  Otitis Media, Adult Otitis media is redness, soreness, and inflammation of the middle ear. Otitis media may be caused by allergies or, most commonly, by infection. Often it occurs as a complication of the common cold. SIGNS AND SYMPTOMS Symptoms of otitis media may include:  Earache.  Fever.  Ringing in your ear.  Headache.  Leakage of fluid from the ear. DIAGNOSIS To diagnose otitis media, your health care provider will examine your ear with an otoscope. This is an instrument that allows your health care provider to see into your ear in order to examine your eardrum. Your health care provider also will ask you questions about your symptoms. TREATMENT  Typically, otitis media resolves on its own within 3-5 days. Your health care provider may prescribe medicine to ease your symptoms of pain. If otitis media does not resolve within 5 days or is recurrent, your health care provider may prescribe antibiotic medicines if he or she suspects that a bacterial infection is the cause. HOME CARE INSTRUCTIONS   If you were prescribed an antibiotic medicine, finish it all even if you start to feel better.  Take medicines only as directed by your health care provider.  Keep all follow-up visits as directed by your health care provider. SEEK MEDICAL CARE IF:  You have otitis media only in one ear, or bleeding from your nose, or both.  You notice a lump on your neck.  You are not getting better in 3-5 days.  You feel worse instead of better. SEEK IMMEDIATE MEDICAL CARE IF:   You have pain that is not controlled with medicine.  You have swelling, redness, or pain around your ear or stiffness in your neck.  You notice that part of your face is paralyzed.  You notice that the bone behind your ear (mastoid) is tender when you touch  it. MAKE SURE YOU:   Understand these instructions.  Will watch your condition.  Will get help right away if you are not doing well or get worse.   This information is not intended to replace advice given to you by your health care provider. Make sure you discuss any questions you have with your health care provider.   Document Released: 11/18/2003 Document Revised: 03/05/2014 Document Reviewed: 09/09/2012 Elsevier Interactive Patient Education [employer]2016 Elsevier Inc.

## 2016-11-13 ENCOUNTER — Emergency Department
Admission: EM | Admit: 2016-11-13 | Discharge: 2016-11-13 | Discharge status: Absent without leave | Payer: Self-pay | Source: Home / Self Care

## 2017-07-21 IMAGING — CR DG ABDOMEN 1V
2 series · 2 of 2 positions shown · non-contrast
Comparison: None.

CLINICAL DATA: Abdominal pain for several days

EXAM:
ABDOMEN - 1 VIEW

[t abdomen supine (1 of 2)]
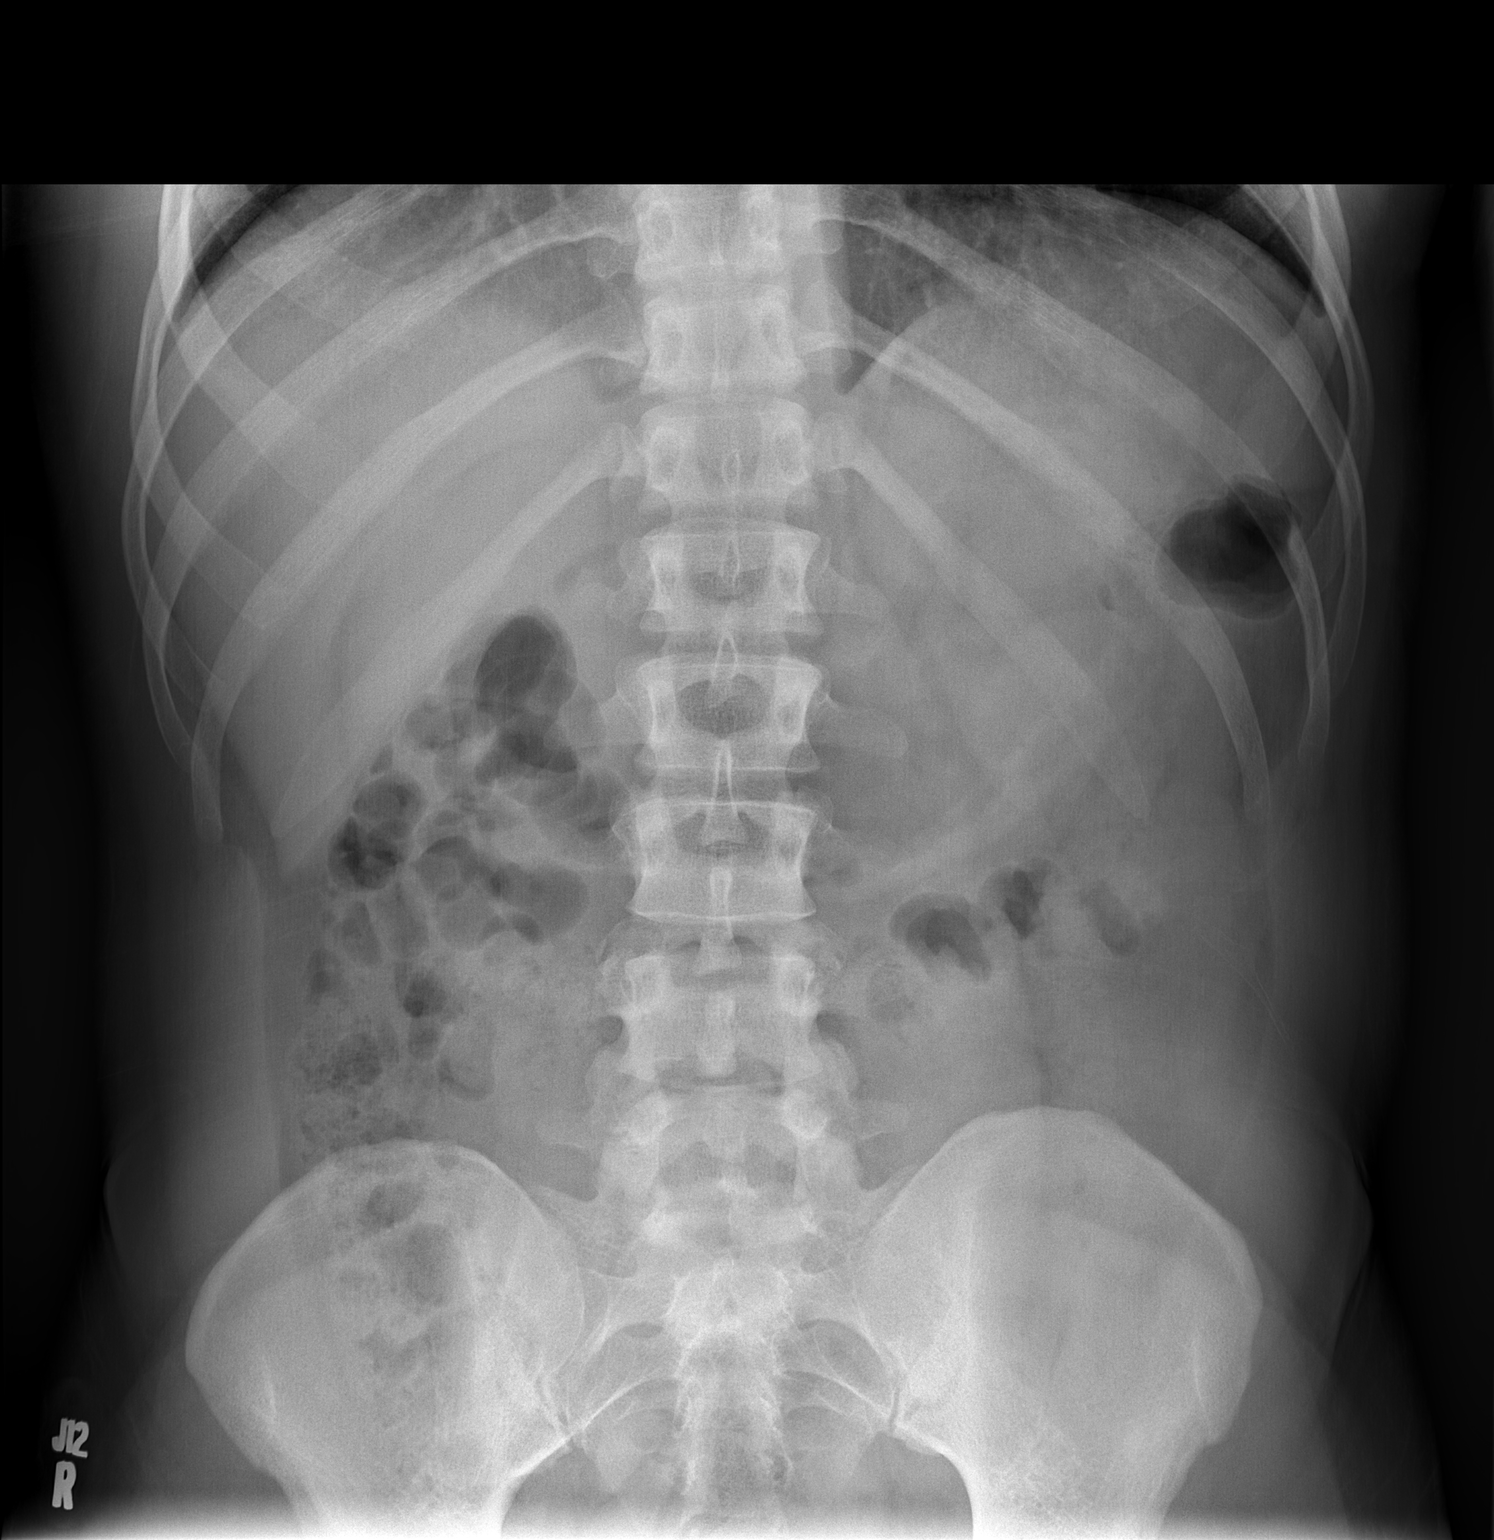

[t abdomen supine (2 of 2)]
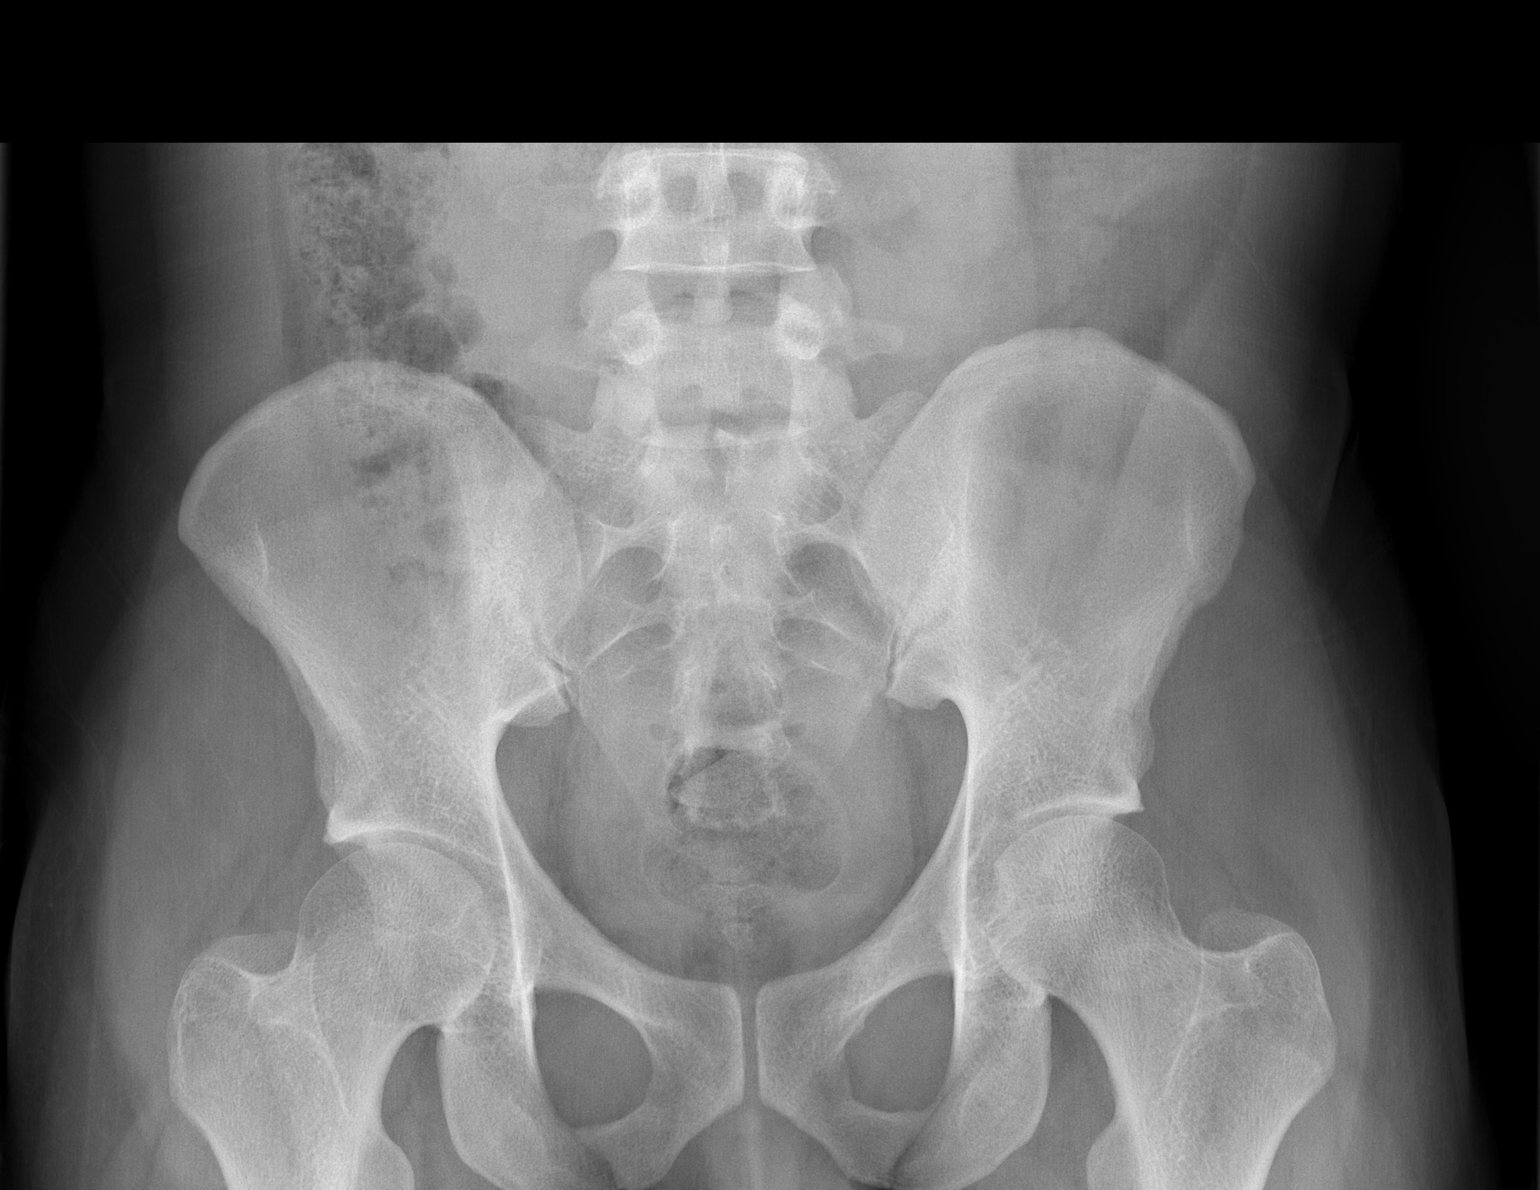

[2 of 2 positions shown; findings below may reference images not displayed]

FINDINGS: There is moderate stool throughout colon. There is no bowel
dilatation or air-fluid level suggesting obstruction. No free air.
No abnormal calcifications. Lung bases clear.
IMPRESSION: Moderate stool throughout colon.  Bowel gas pattern unremarkable.

## 2019-09-10 ENCOUNTER — Emergency Department (HOSPITAL_BASED_OUTPATIENT_CLINIC_OR_DEPARTMENT_OTHER)
Admission: EM | Admit: 2019-09-10 | Discharge: 2019-09-10 | Disposition: A | Discharge status: Home / Self Care | Payer: Self-pay | Attending: Emergency Medicine | Admitting: Emergency Medicine

## 2019-09-10 ENCOUNTER — Other Ambulatory Visit: Payer: Self-pay

## 2019-09-10 ENCOUNTER — Encounter (HOSPITAL_BASED_OUTPATIENT_CLINIC_OR_DEPARTMENT_OTHER): Payer: Self-pay | Admitting: *Deleted

## 2019-09-10 ENCOUNTER — Emergency Department (HOSPITAL_BASED_OUTPATIENT_CLINIC_OR_DEPARTMENT_OTHER): Payer: Self-pay

## 2019-09-10 DIAGNOSIS — R0789 Other chest pain: Secondary | ICD-10-CM | POA: Insufficient documentation

## 2019-09-10 DIAGNOSIS — I10 Essential (primary) hypertension: Secondary | ICD-10-CM | POA: Insufficient documentation

## 2019-09-10 LAB — CBC WITH DIFFERENTIAL/PLATELET
Abs Immature Granulocytes: 0.01 10*3/uL (ref 0.00–0.07)
Basophils Absolute: 0 10*3/uL (ref 0.0–0.1)
Basophils Relative: 1 %
Eosinophils Absolute: 0.4 10*3/uL (ref 0.0–0.5)
Eosinophils Relative: 7 %
HCT: 49.7 % (ref 39.0–52.0)
Hemoglobin: 16.3 g/dL (ref 13.0–17.0)
Immature Granulocytes: 0 %
Lymphocytes Relative: 35 %
Lymphs Abs: 2 10*3/uL (ref 0.7–4.0)
MCH: 30.2 pg (ref 26.0–34.0)
MCHC: 32.8 g/dL (ref 30.0–36.0)
MCV: 92.2 fL (ref 80.0–100.0)
Monocytes Absolute: 0.5 10*3/uL (ref 0.1–1.0)
Monocytes Relative: 9 %
Neutro Abs: 2.9 10*3/uL (ref 1.7–7.7)
Neutrophils Relative %: 48 %
Platelets: 226 10*3/uL (ref 150–400)
RBC: 5.39 MIL/uL (ref 4.22–5.81)
RDW: 12.8 % (ref 11.5–15.5)
WBC: 5.8 10*3/uL (ref 4.0–10.5)
nRBC: 0 % (ref 0.0–0.2)

## 2019-09-10 LAB — BASIC METABOLIC PANEL
Anion gap: 10 (ref 5–15)
BUN: 12 mg/dL (ref 6–20)
CO2: 27 mmol/L (ref 22–32)
Calcium: 9.6 mg/dL (ref 8.9–10.3)
Chloride: 105 mmol/L (ref 98–111)
Creatinine, Ser: 1.41 mg/dL — ABNORMAL HIGH (ref 0.61–1.24)
GFR calc Af Amer: >60 mL/min (ref >60)
GFR calc non Af Amer: >60 mL/min (ref >60)
Glucose, Bld: 108 mg/dL — ABNORMAL HIGH (ref 70–99)
Potassium: 4.1 mmol/L (ref 3.5–5.1)
Sodium: 142 mmol/L (ref 135–145)

## 2019-09-10 LAB — TROPONIN I (HIGH SENSITIVITY): Troponin I (High Sensitivity): 3 ng/L (ref <18)

## 2019-09-10 NOTE — ED Provider Notes (Signed)
[facility] EMERGENCY DEPARTMENT Provider Note   CSN: [id number] Arrival date & time: 09/10/19  1621     History Chief Complaint  Patient presents with  . Chest Pain    Trevor Pope is a 29 y.o. male with a past medical history of hypertension noncompliant with amlodipine presenting to the ED with a chief complaint of chest pain.  Yesterday while at work developed right-sided chest pain that has been intermittent without specific aggravating or alleviating factor.  Went to sleep in hopes that the pain would improve but woke up this morning with persistent pain.  He has not taken any medications to help with the pain.  No recent strenuous activity or exertion.  Denies any shortness of breath, leg swelling, abdominal pain, vomiting, nausea, fever, recent immobilization, history of DVT, PE or MI.  HPI     Past Medical History:  Diagnosis Date  . Hypertension     There are no problems to display for this patient.   History reviewed. No pertinent surgical history.     No family history on file.  Social History   Tobacco Use  . Smoking status: Never Smoker  . Smokeless tobacco: Never Used  Vaping Use  . Vaping Use: Never used  Substance Use Topics  . Alcohol use: No  . Drug use: No    Home Medications Prior to Admission medications   Medication Sig Start Date End Date Taking? Authorizing Provider  AmLODIPine Besylate (NORVASC PO) Take by mouth.    [provider]    Allergies    Patient has no known allergies.  Review of Systems   Review of Systems  Constitutional: Negative for appetite change, chills and fever.  HENT: Negative for ear pain, rhinorrhea, sneezing and sore throat.   Eyes: Negative for photophobia and visual disturbance.  Respiratory: Negative for cough, chest tightness, shortness of breath and wheezing.   Cardiovascular: Positive for chest pain. Negative for palpitations.  Gastrointestinal: Negative for abdominal pain, blood  in stool, constipation, diarrhea, nausea and vomiting.  Genitourinary: Negative for dysuria, hematuria and urgency.  Musculoskeletal: Negative for myalgias.  Skin: Negative for rash.  Neurological: Negative for dizziness, weakness and light-headedness.    Physical Exam Updated Vital Signs BP (!) 157/107 (BP Location: Right Arm)   Pulse 85   Temp 98.9 F (37.2 C)   Ht 5\' 8"  (1.727 m)   Wt 77.1 kg   SpO2 99%   BMI 25.85 kg/m   Physical Exam Vitals and nursing note reviewed.  Constitutional:      General: He is not in acute distress.    Appearance: He is well-developed.     Comments: Speaking complete sentences.difficulty.  No signs of respiratory distress.  HENT:     Head: Normocephalic and atraumatic.     Nose: Nose normal.  Eyes:     General: No scleral icterus.       Left eye: No discharge.     Conjunctiva/sclera: Conjunctivae normal.  Cardiovascular:     Rate and Rhythm: Normal rate and regular rhythm.     Heart sounds: Normal heart sounds. No murmur heard.  No friction rub. No gallop.   Pulmonary:     Effort: Pulmonary effort is normal. No respiratory distress.     Breath sounds: Normal breath sounds.  Abdominal:     General: Bowel sounds are normal. There is no distension.     Palpations: Abdomen is soft.     Tenderness: There is no abdominal tenderness.  There is no guarding.  Musculoskeletal:        General: Normal range of motion.     Cervical back: Normal range of motion and neck supple.     Right lower leg: No tenderness. No edema.     Left lower leg: No tenderness. No edema.     Comments: No lower extremity edema, erythema or calf tenderness bilaterally.  Skin:    General: Skin is warm and dry.     Findings: No rash.  Neurological:     Mental Status: He is alert.     Motor: No abnormal muscle tone.     Coordination: Coordination normal.     ED Results / Procedures / Treatments   Labs (all labs ordered are listed, but only abnormal results are  displayed) Labs Reviewed  BASIC METABOLIC PANEL - Abnormal; Notable for the following components:      Result Value   Glucose, Bld 108 (*)    Creatinine, Ser 1.41 (*)    All other components within normal limits  CBC WITH DIFFERENTIAL/PLATELET  TROPONIN I (HIGH SENSITIVITY)    EKG EKG Interpretation  Date/Time:  Thursday September 10 2019 16:31:44 EDT Ventricular Rate:  78 PR Interval:    QRS Duration: 77 QT Interval:  373 QTC Calculation: 425 R Axis:   73 Text Interpretation: Sinus rhythm Probable left atrial enlargement No old tracing to compare Confirmed by Melene Plan [phone]) on 09/10/2019 5:42:59 PM   Radiology DG Chest 2 View  Result Date: 09/10/2019 CLINICAL DATA:  Right-sided chest pain. EXAM: CHEST - 2 VIEW COMPARISON:  06/01/2015 FINDINGS: Midline trachea.  Normal heart size and mediastinal contours. Sharp costophrenic angles.  No pneumothorax.  Clear lungs. Minimal S shaped thoracolumbar spine curvature. Numerous leads and wires project over the chest. IMPRESSION: No active cardiopulmonary disease. Electronically Signed   By: Jeronimo Greaves M.D.   On: 09/10/2019 17:30    Procedures Procedures (including critical care time)  Medications Ordered in ED Medications - No data to display  ED Course  I have reviewed the triage vital signs and the nursing notes.  Pertinent labs & imaging results that were available during my care of the patient were reviewed by me and considered in my medical decision making (see chart for details).    MDM Rules/Calculators/A&P                          29 year old male with a past medical history of hypertension noncompliant with antihypertensive presents to the ED with a chief complaint of chest pain.  Intermittent right-sided chest pain since being at work yesterday.  No specific trigger.  No changes with exertion.  No recent strenuous activity.  Denies shortness of breath, cough, leg swelling, history of DVT, PE, MI recent immobilization.   On exam there is no lower extremity edema, erythema or calf tenderness of concern me for DVT.  He is speaking complete sentences without difficulty, no signs of respiratory distress or airway compromise.  Lungs are clear to auscultation bilaterally.  He is afebrile without recent use of antipyretics.  He is chest pain-free on my exam.  EKG here shows sinus rhythm no ischemic changes.  No prior tracings for comparison.  Chest x-ray is unremarkable.  Lab work ordered and interpreted, unremarkable with the exception of creatinine of 1.4, last creatinine was 1.3 approximately 4 years ago.  Troponin is negative x1.  CBC is unremarkable.  Patient informed of his slight elevation  in his creatinine.  He has a heart score of 1.  He is PERC negative.  Based on today's work-up I doubt ACS, PE or other emergent cause of his chest pain such as pneumonia, pneumothorax or dissection.  Suspect musculoskeletal cause versus gastritis versus anxiety.  He is requesting discharge home which I feel is reasonable.  He is chest pain-free throughout the entirety of the ED course.  We will have him follow-up with PCP and return for worsening symptoms.  I did urge him to remain compliant with his antihypertensive which he states he will try to do.  All imaging, if done today, including plain films, CT scans, and ultrasounds, independently reviewed by me, and interpretations confirmed via formal radiology reads.  Patient is hemodynamically stable, in NAD, and Trevor to ambulate in the ED. Evaluation does not show pathology that would require ongoing emergent intervention or inpatient treatment. I explained the diagnosis to the patient. Pain has been managed and has no complaints prior to discharge. Patient is comfortable with above plan and is stable for discharge at this time. All questions were answered prior to disposition. Strict return precautions for returning to the ED were discussed. Encouraged follow up with PCP.   An After Visit  Summary was printed and given to the patient.   Portions of this note were generated with Scientist, clinical (histocompatibility and immunogenetics). Dictation errors may occur despite best attempts at proofreading.  Final Clinical Impression(s) / ED Diagnoses Final diagnoses:  Chest wall pain    Rx / DC Orders ED Discharge Orders    None       Dietrich Pates, PA-C 09/10/19 1814    Melene Plan, DO 09/10/19 1916

## 2019-09-10 NOTE — ED Triage Notes (Addendum)
Pt c/o right rib pain with movt  w/o injury x 1 hr

## 2019-09-10 NOTE — ED Notes (Signed)
Patient transported to X-ray 

## 2019-09-10 NOTE — Discharge Instructions (Addendum)
Follow-up with your primary care provider. Make sure you are taking your blood pressure medication as prescribed every day. Follow-up with your primary care provider. Return to the ER for worsening chest pain, shortness of breath, leg swelling, vomiting or coughing up blood.

## 2022-03-28 IMAGING — CR DG CHEST 2V
2 series · 2 of 2 positions shown · non-contrast
Comparison: 06/01/2015

CLINICAL DATA: Right-sided chest pain.

EXAM:
CHEST - 2 VIEW

[w chest pa]
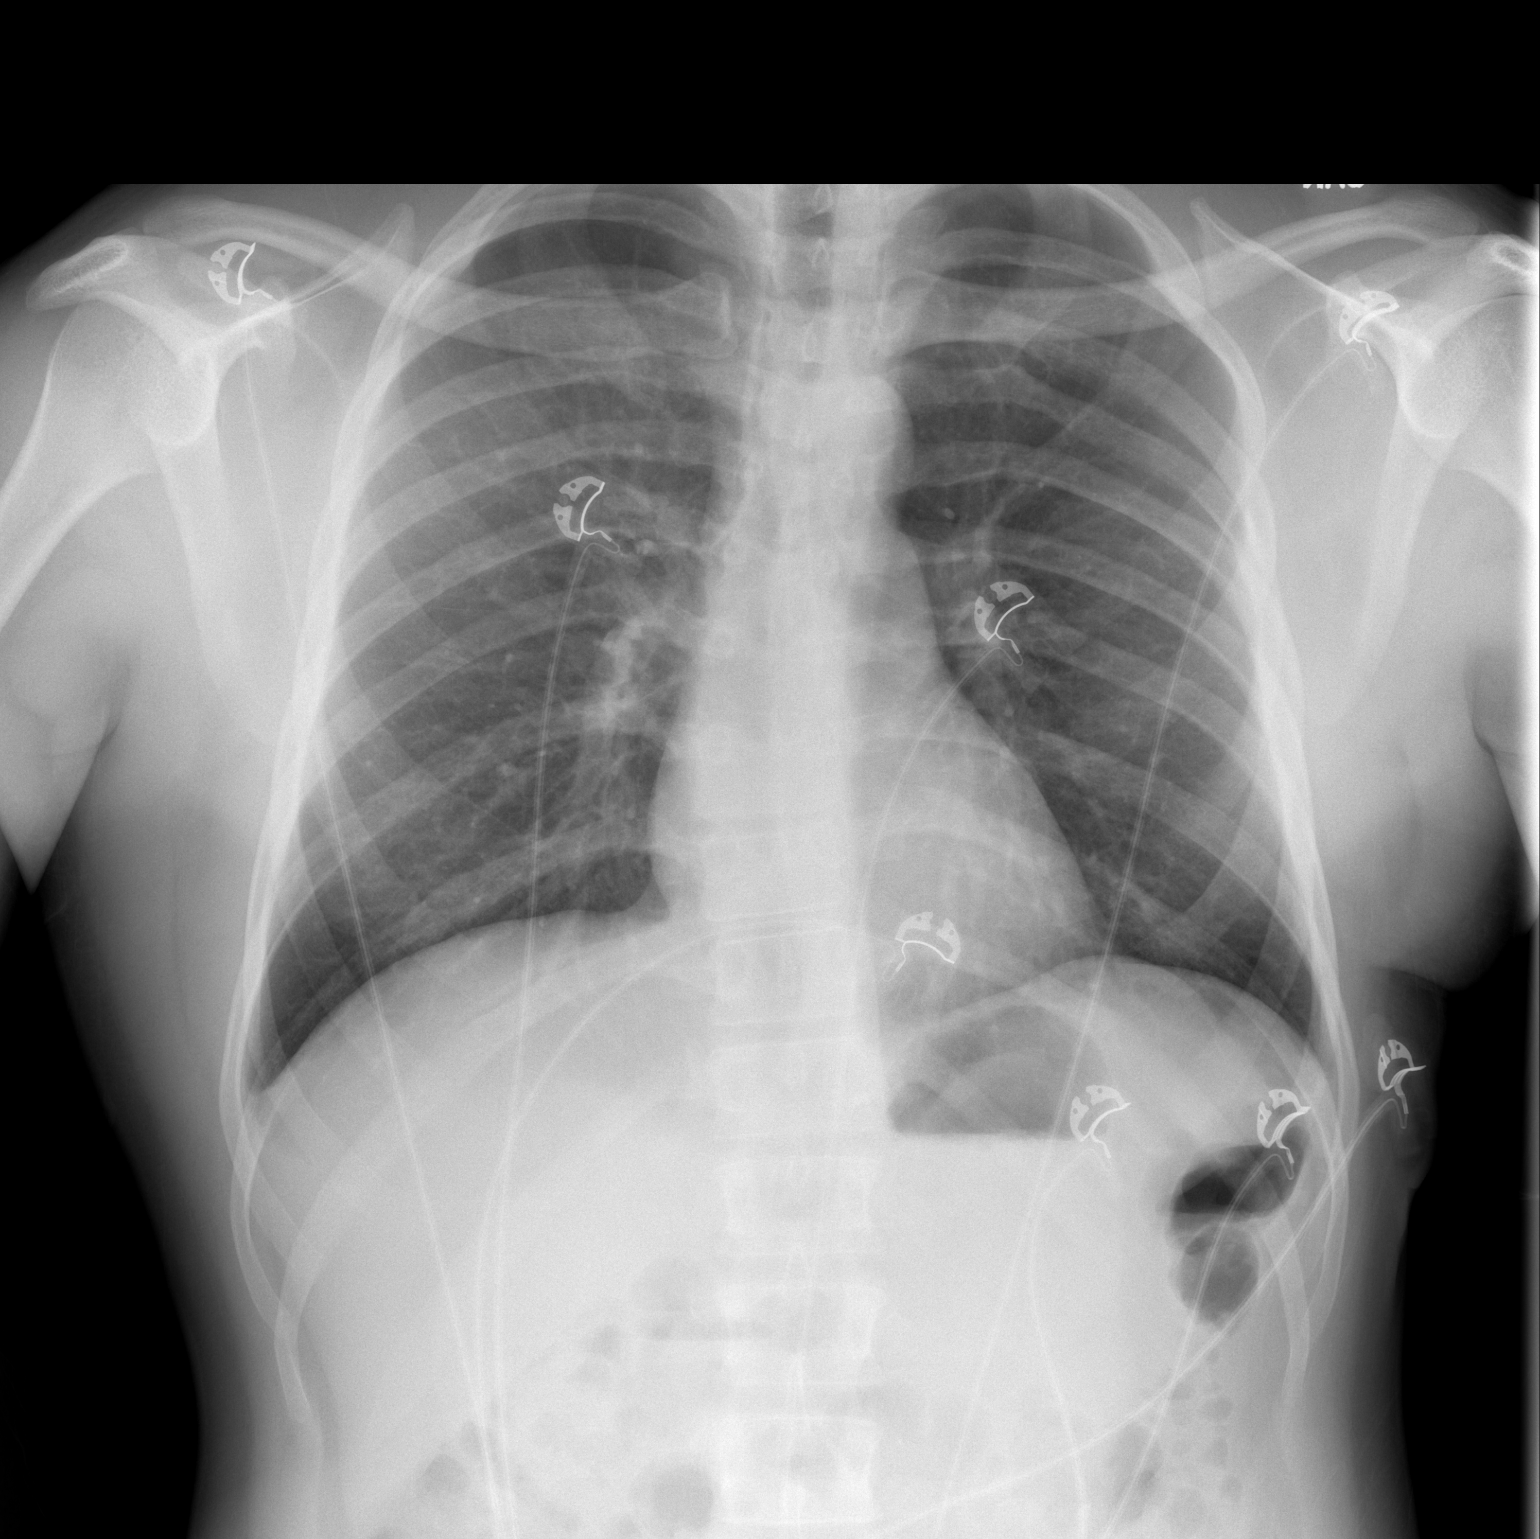

[w chest lat]
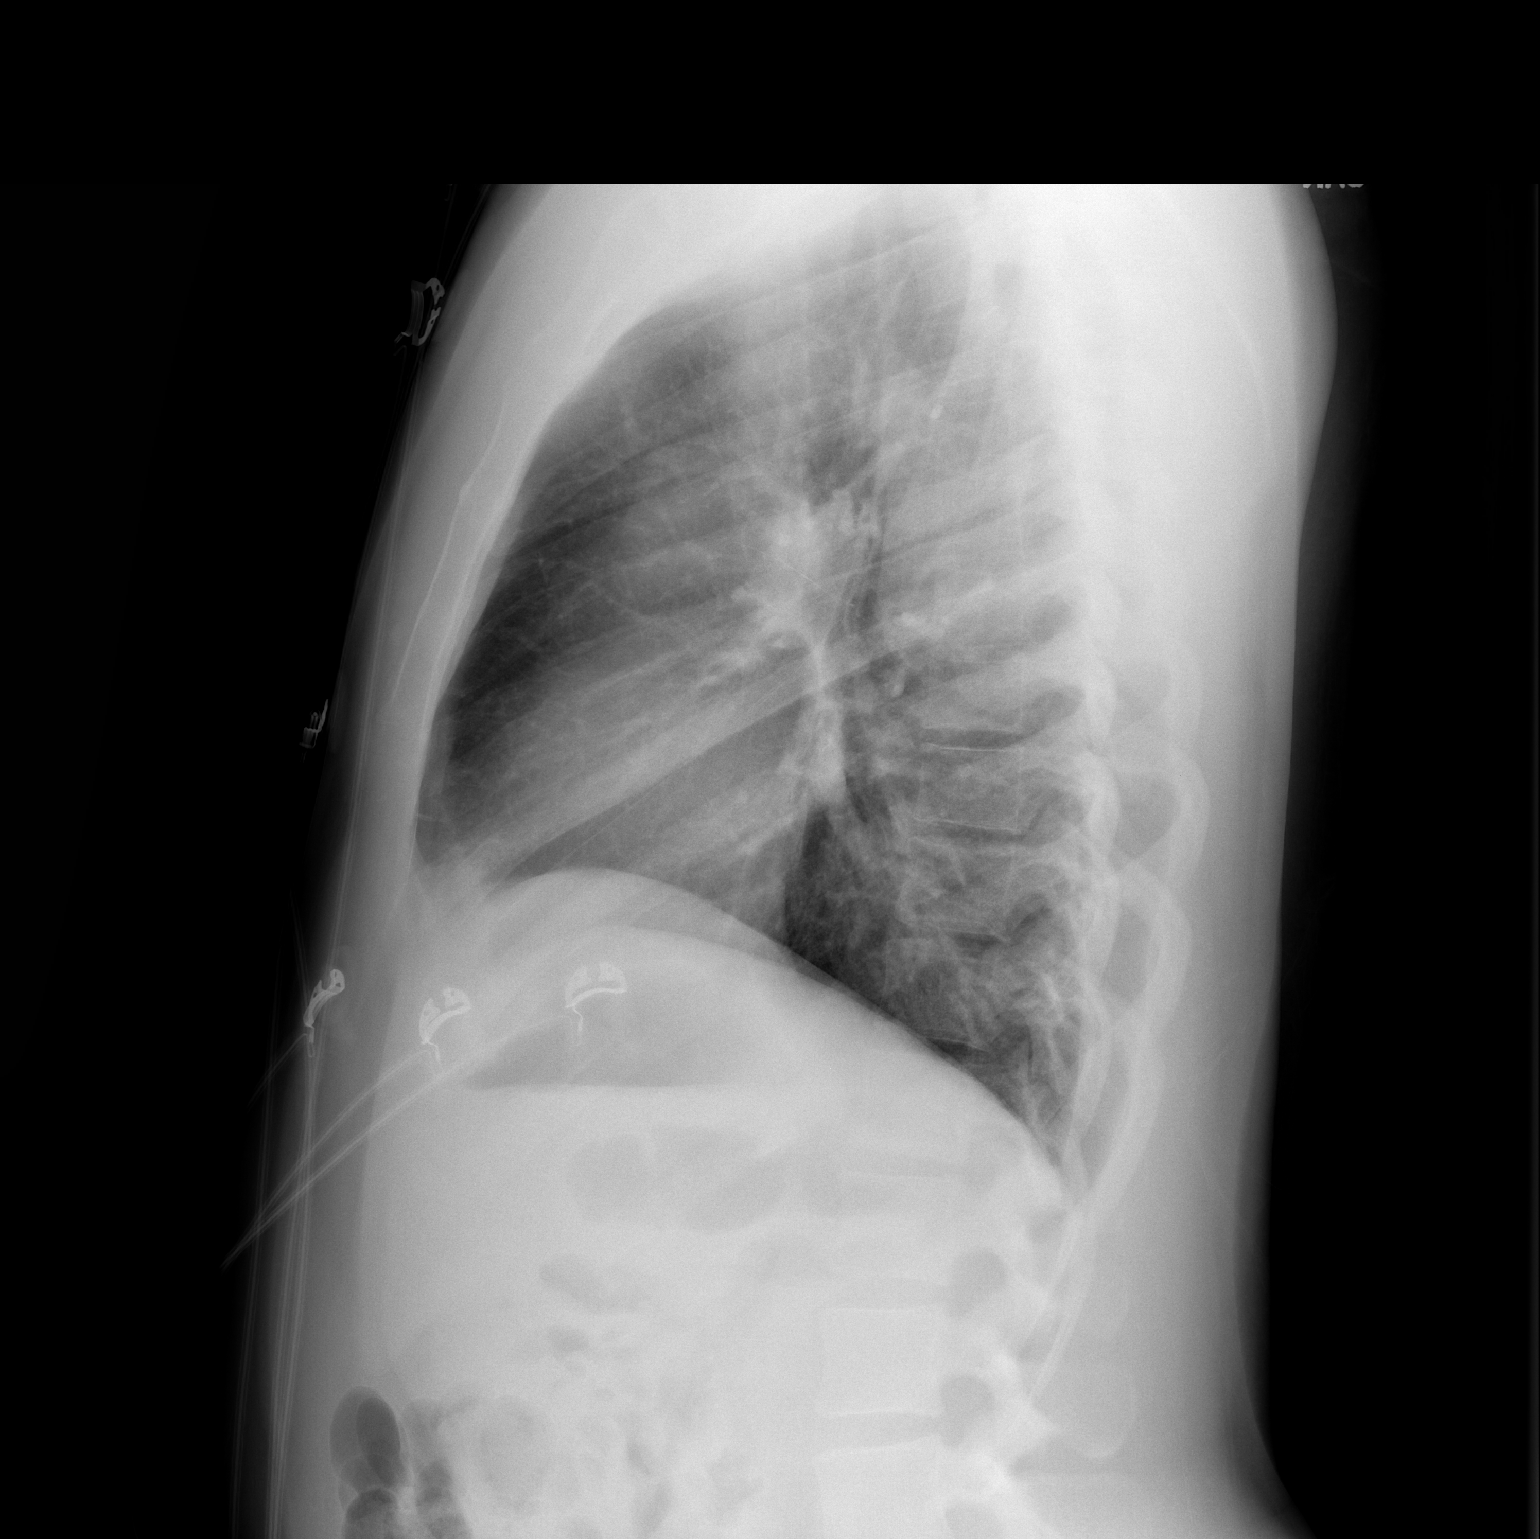

[2 of 2 positions shown; findings below may reference images not displayed]

FINDINGS: Midline trachea.  Normal heart size and mediastinal contours.

Sharp costophrenic angles.  No pneumothorax.  Clear lungs.

Minimal S shaped thoracolumbar spine curvature. Numerous leads and
wires project over the chest.
IMPRESSION: No active cardiopulmonary disease.
